# Patient Record
Sex: Female | Born: 1960 | Race: White | Hispanic: No | Marital: Married | Smoking: Former smoker
Health system: Southern US, Community
[De-identification: ages and names within clinical notes are randomized; demographics above are authoritative.]

## PROBLEM LIST (undated history)

## (undated) DIAGNOSIS — I2699 Other pulmonary embolism without acute cor pulmonale: Secondary | ICD-10-CM

## (undated) DIAGNOSIS — I82409 Acute embolism and thrombosis of unspecified deep veins of unspecified lower extremity: Secondary | ICD-10-CM

## (undated) DIAGNOSIS — E785 Hyperlipidemia, unspecified: Secondary | ICD-10-CM

## (undated) HISTORY — PX: TUBAL LIGATION: SHX77

## (undated) HISTORY — DX: Other pulmonary embolism without acute cor pulmonale: I26.99

## (undated) HISTORY — DX: Acute embolism and thrombosis of unspecified deep veins of unspecified lower extremity: I82.409

## (undated) HISTORY — DX: Hyperlipidemia, unspecified: E78.5

## (undated) HISTORY — PX: TONSILECTOMY, ADENOIDECTOMY, BILATERAL MYRINGOTOMY AND TUBES: SHX2538

---

## 2012-05-05 DIAGNOSIS — I82409 Acute embolism and thrombosis of unspecified deep veins of unspecified lower extremity: Secondary | ICD-10-CM

## 2012-05-05 DIAGNOSIS — I2699 Other pulmonary embolism without acute cor pulmonale: Secondary | ICD-10-CM

## 2012-05-05 HISTORY — DX: Acute embolism and thrombosis of unspecified deep veins of unspecified lower extremity: I82.409

## 2012-05-05 HISTORY — DX: Other pulmonary embolism without acute cor pulmonale: I26.99

## 2013-04-25 ENCOUNTER — Ambulatory Visit: Payer: Self-pay | Admitting: Family Medicine

## 2014-09-21 LAB — LIPID PANEL
CHOLESTEROL: 185 mg/dL (ref 0–200)
LDL Cholesterol: 109 mg/dL
TRIGLYCERIDES: 134 mg/dL (ref 40–160)

## 2014-09-21 LAB — HEMOGLOBIN A1C: Hgb A1c MFr Bld: 5.8 % (ref 4.0–6.0)

## 2014-09-29 IMAGING — MG MAM DGTL SCRN MAM NO ORDER W/CAD
1 series · 4 of 4 positions shown · non-contrast
Comparison: None.

CLINICAL DATA: Screening.

EXAM:
DIGITAL SCREENING BILATERAL MAMMOGRAM WITH CAD

[R CC · right · 4 of 4 slices shown]
[im 1/4]
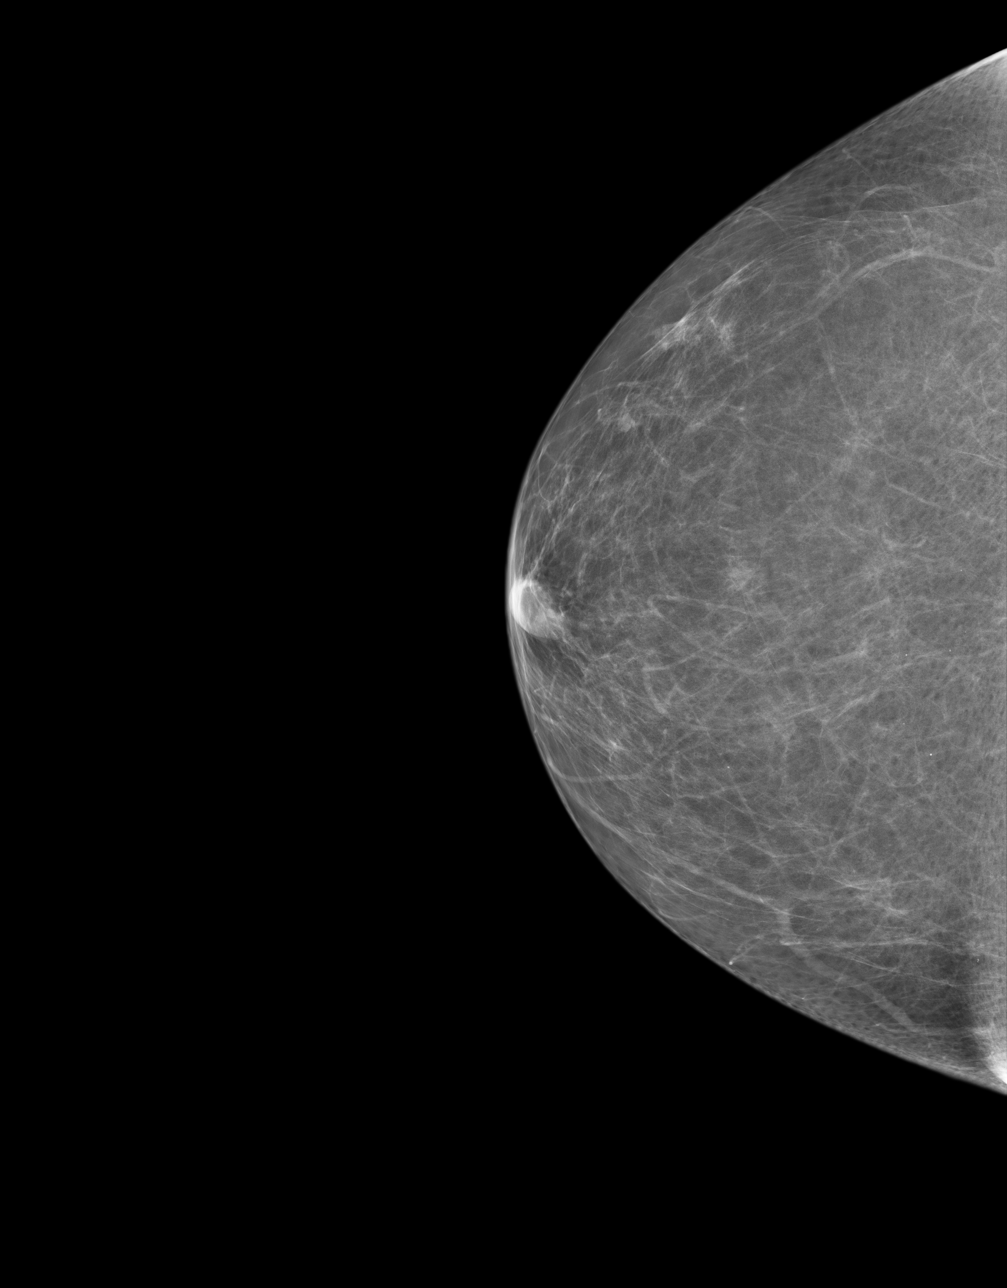
[im 2/4]
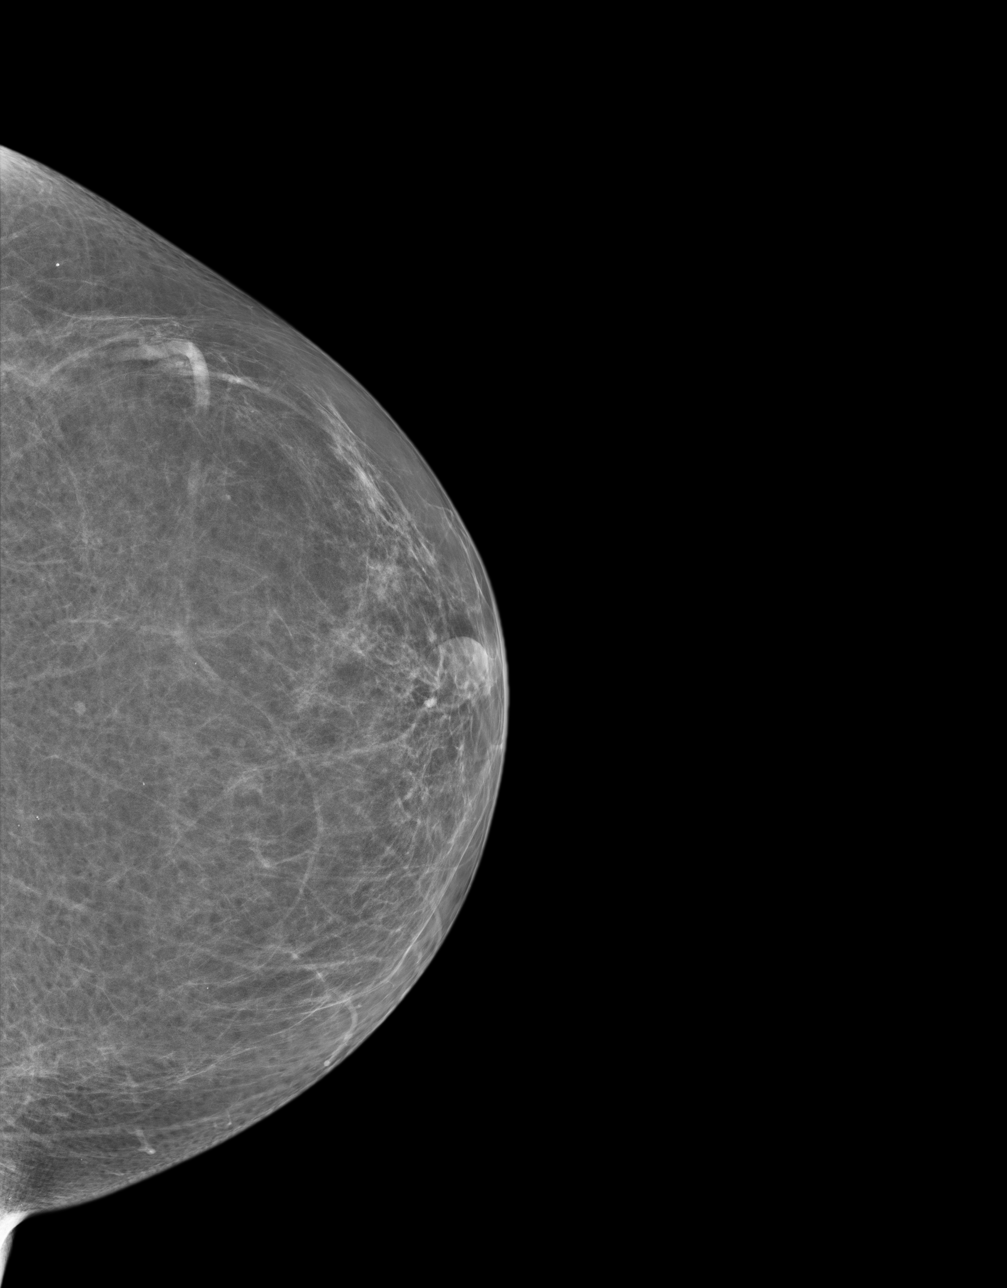
[im 3/4]
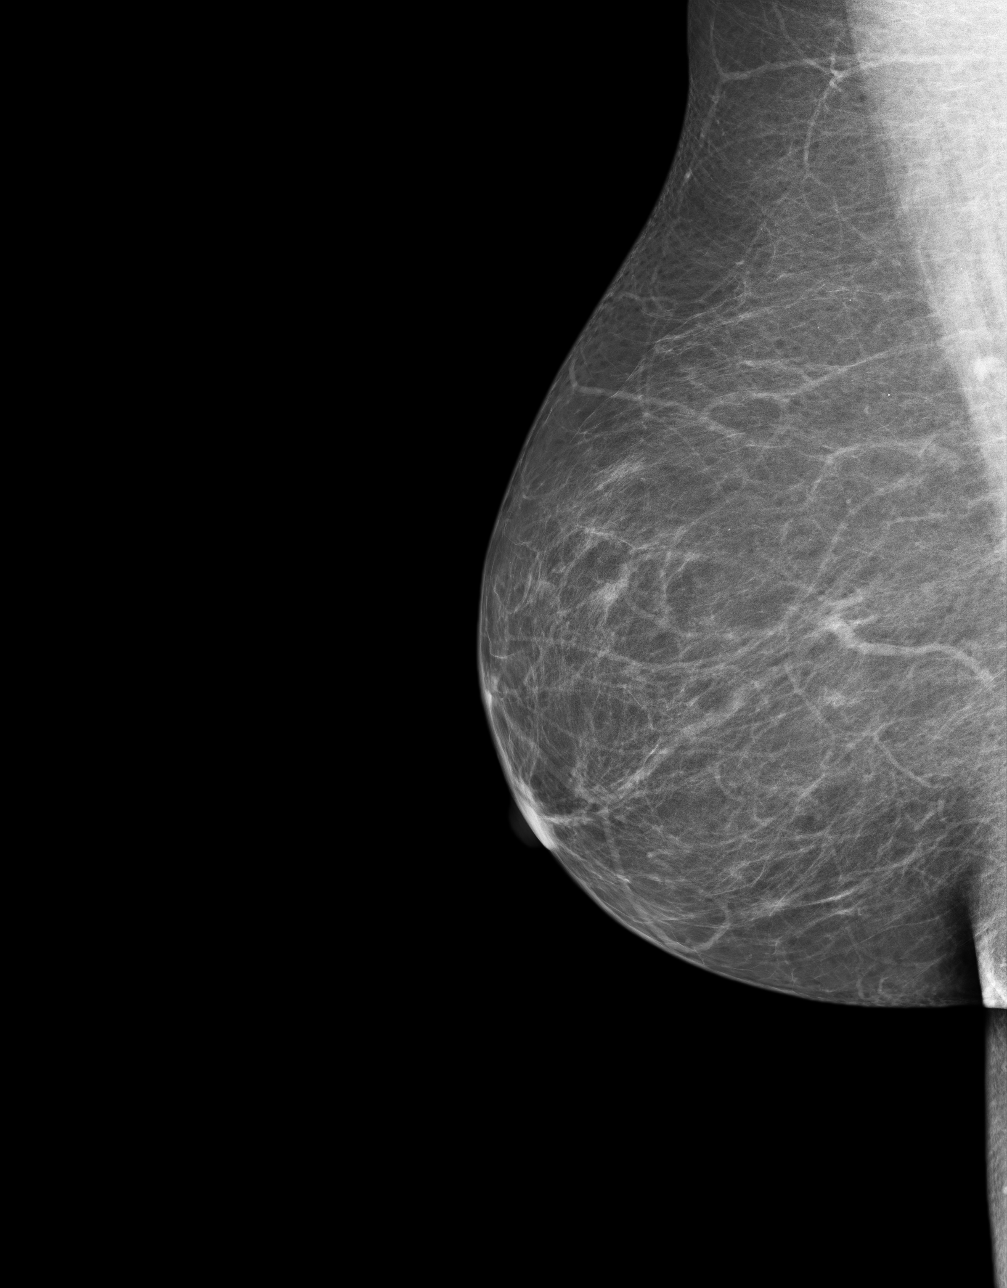
[im 4/4]
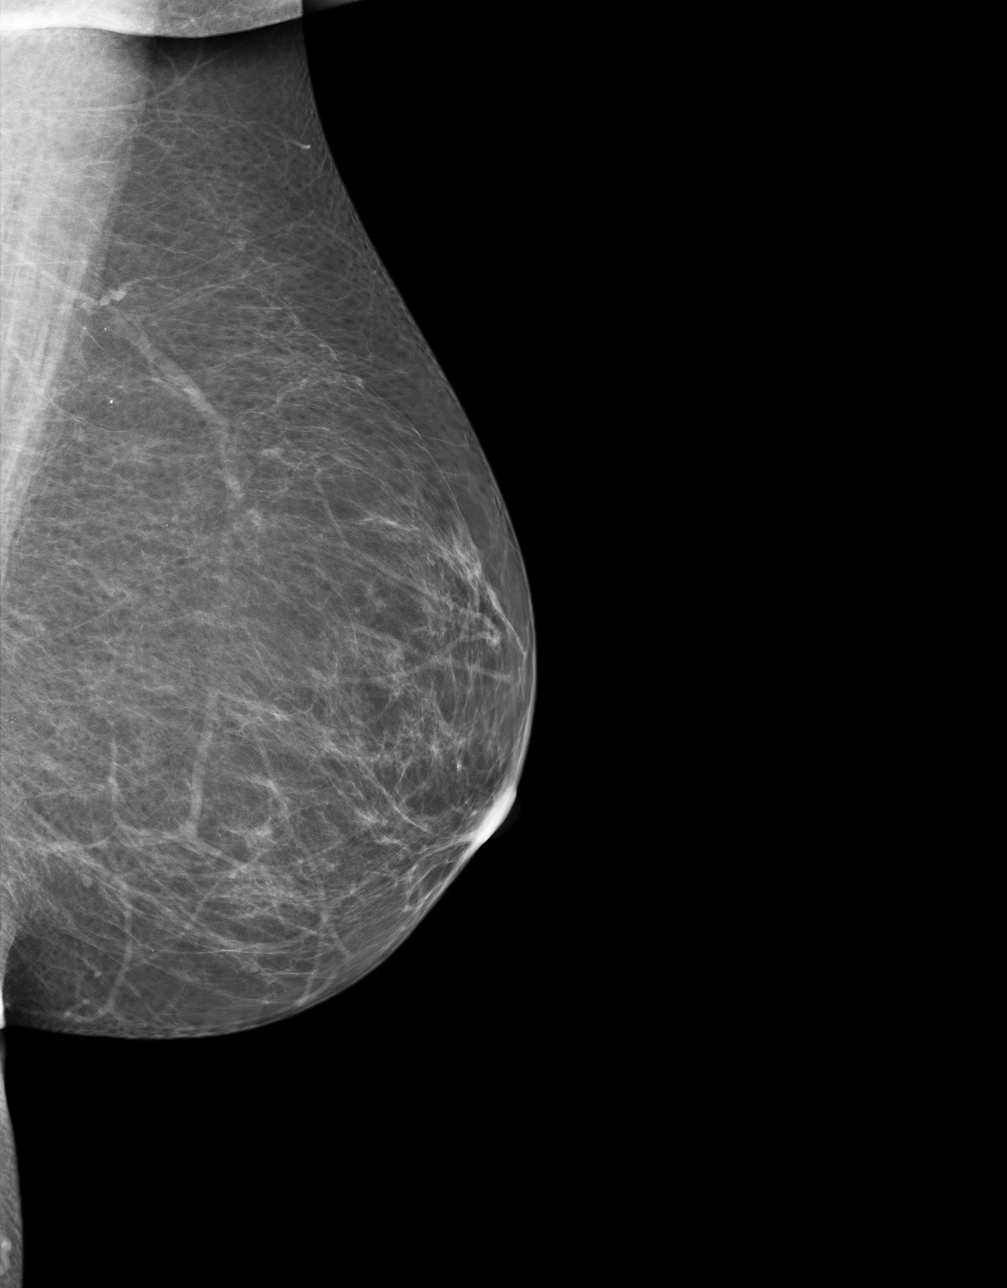

[4 of 4 positions shown; findings below may reference images not displayed]

ACR Breast Density Category b: There are scattered areas of
fibroglandular density.
FINDINGS: There are no findings suspicious for malignancy. Images were
processed with CAD.
IMPRESSION: No mammographic evidence of malignancy. A result letter of this
screening mammogram will be mailed directly to the patient.

RECOMMENDATION:
Screening mammogram in one year. (Code:WX-X-66M)

BI-RADS CATEGORY  1: Negative

## 2014-12-15 ENCOUNTER — Encounter: Payer: Self-pay | Admitting: Family Medicine

## 2014-12-15 ENCOUNTER — Ambulatory Visit
Admission: RE | Admit: 2014-12-15 | Discharge: 2014-12-15 | Disposition: A | Discharge status: Home / Self Care | Payer: Federal, State, Local not specified - PPO | Source: Ambulatory Visit | Attending: Family Medicine | Admitting: Family Medicine

## 2014-12-15 ENCOUNTER — Ambulatory Visit (INDEPENDENT_AMBULATORY_CARE_PROVIDER_SITE_OTHER): Payer: Federal, State, Local not specified - PPO | Admitting: Family Medicine

## 2014-12-15 ENCOUNTER — Telehealth: Payer: Self-pay | Admitting: Family Medicine

## 2014-12-15 VITALS — BP 109/74 | HR 62 | Temp 98.5°F | Resp 16 | Ht 66.0 in | Wt 273.6 lb

## 2014-12-15 DIAGNOSIS — M25511 Pain in right shoulder: Secondary | ICD-10-CM | POA: Insufficient documentation

## 2014-12-15 DIAGNOSIS — I2699 Other pulmonary embolism without acute cor pulmonale: Secondary | ICD-10-CM | POA: Insufficient documentation

## 2014-12-15 DIAGNOSIS — M79609 Pain in unspecified limb: Secondary | ICD-10-CM | POA: Insufficient documentation

## 2014-12-15 DIAGNOSIS — M7710 Lateral epicondylitis, unspecified elbow: Secondary | ICD-10-CM | POA: Insufficient documentation

## 2014-12-15 DIAGNOSIS — I890 Lymphedema, not elsewhere classified: Secondary | ICD-10-CM | POA: Insufficient documentation

## 2014-12-15 DIAGNOSIS — E785 Hyperlipidemia, unspecified: Secondary | ICD-10-CM | POA: Insufficient documentation

## 2014-12-15 DIAGNOSIS — IMO0002 Reserved for concepts with insufficient information to code with codable children: Secondary | ICD-10-CM | POA: Insufficient documentation

## 2014-12-15 DIAGNOSIS — I82401 Acute embolism and thrombosis of unspecified deep veins of right lower extremity: Secondary | ICD-10-CM | POA: Insufficient documentation

## 2014-12-15 DIAGNOSIS — E668 Other obesity: Secondary | ICD-10-CM | POA: Insufficient documentation

## 2014-12-15 DIAGNOSIS — R739 Hyperglycemia, unspecified: Secondary | ICD-10-CM | POA: Insufficient documentation

## 2014-12-15 MED ORDER — NAPROXEN 375 MG PO TABS: 375.0000 mg | ORAL_TABLET | Freq: Two times a day (BID) | ORAL | Status: AC

## 2014-12-15 NOTE — Assessment & Plan Note (Signed)
Refer to ortho for chronic and recurrent R shoulder pain. Obtain XR today. Suspect rotator cuff injury. Short course of NSAIDs due to past history of DVT.  Tylenol otherwise for pain. Consider PT if unable to see ortho in reasonable time.

## 2014-12-15 NOTE — Patient Instructions (Addendum)
We will get you to orthopedics to evaluate your shoulder. We will also get an XRay today to see what is going on.  Let's try naproxen up to twice daily for 1 week to see how it affects your symptoms. Start off with once daily with breakfast to see how it helps.    Shoulder Pain The shoulder is the joint that connects your arms to your body. The bones that form the shoulder joint include the upper arm bone (humerus), the shoulder blade (scapula), and the collarbone (clavicle). The top of the humerus is shaped like a ball and fits into a rather flat socket on the scapula (glenoid cavity). A combination of muscles and strong, fibrous tissues that connect muscles to bones (tendons) support your shoulder joint and hold the ball in the socket. Small, fluid-filled sacs (bursae) are located in different areas of the joint. They act as cushions between the bones and the overlying soft tissues and help reduce friction between the gliding tendons and the bone as you move your arm. Your shoulder joint allows a wide range of motion in your arm. This range of motion allows you to do things like scratch your back or throw a ball. However, this range of motion also makes your shoulder more prone to pain from overuse and injury. Causes of shoulder pain can originate from both injury and overuse and usually can be grouped in the following four categories:  Redness, swelling, and pain (inflammation) of the tendon (tendinitis) or the bursae (bursitis).  Instability, such as a dislocation of the joint.  Inflammation of the joint (arthritis).  Broken bone (fracture). HOME CARE INSTRUCTIONS   Apply ice to the sore area.  Put ice in a plastic bag.  Place a towel between your skin and the bag.  Leave the ice on for 15-20 minutes, 3-4 times per day for the first 2 days, or as directed by your health care provider.  Stop using cold packs if they do not help with the pain.  If you have a shoulder sling or immobilizer,  wear it as long as your caregiver instructs. Only remove it to shower or bathe. Move your arm as little as possible, but keep your hand moving to prevent swelling.  Squeeze a soft ball or foam pad as much as possible to help prevent swelling.  Only take over-the-counter or prescription medicines for pain, discomfort, or fever as directed by your caregiver. SEEK MEDICAL CARE IF:   Your shoulder pain increases, or new pain develops in your arm, hand, or fingers.  Your hand or fingers become cold and numb.  Your pain is not relieved with medicines. SEEK IMMEDIATE MEDICAL CARE IF:   Your arm, hand, or fingers are numb or tingling.  Your arm, hand, or fingers are significantly swollen or turn white or blue. MAKE SURE YOU:   Understand these instructions.  Will watch your condition.  Will get help right away if you are not doing well or get worse. Document Released: 01/29/2005 Document Revised: 09/05/2013 Document Reviewed: 04/05/2011 [facility] Patient Information 2015 [place], [place]. This information is not intended to replace advice given to you by your health care provider. Make sure you discuss any questions you have with your health care provider.

## 2014-12-15 NOTE — Addendum Note (Signed)
Addended by: Elvina Mattes D on: 12/15/2014 09:21 AM   Modules accepted: Kipp Brood

## 2014-12-15 NOTE — Telephone Encounter (Signed)
LMTCB regarding XR results.

## 2014-12-15 NOTE — Telephone Encounter (Signed)
LMTCB twice. 

## 2014-12-15 NOTE — Progress Notes (Signed)
Subjective:    Patient ID: Jessica Jensen, female    DOB: [birth year], 54 y.o.   MRN: [id number]  HPI: Jessica Jensen is a 54 y.o. female presenting on 12/15/2014 for Shoulder Pain   HPI  Pt presents for follow-up of shoulder pain. This has been a persistent issues for the past few months. Shoulder pain improved when she stopped her lovastatin and reduced her activity with her job.  She does a fair amount of lifting in her job.  Pain is in both shoulders but R>L. Most painful movement is reaching and turning elbow. Pain begins in shoulder and radiates down the arm.   Past Medical History  Diagnosis Date  . Hyperlipidemia   . DVT (deep venous thrombosis) 2014  . Pulmonary embolism 2014    No current outpatient prescriptions on file prior to visit.   No current facility-administered medications on file prior to visit.    Review of Systems  Constitutional: Negative for fever and chills.  HENT: Negative.   Eyes: Negative.   Respiratory: Negative for chest tightness, shortness of breath and wheezing.   Cardiovascular: Negative for chest pain, palpitations and leg swelling.  Genitourinary: Negative.   Musculoskeletal: Positive for arthralgias.  Neurological: Positive for weakness (R compared to L.). Negative for dizziness and facial asymmetry.   Per HPI unless specifically indicated above     Objective:    BP 109/74 mmHg  Pulse 62  Temp(Src) 98.5 F (36.9 C) (Oral)  Resp 16  Ht  (1.676 m)  Wt 273 lb 9.6 oz (124.104 kg)  BMI 44.18 kg/m2  LMP   Wt Readings from Last 3 Encounters:  12/15/14 273 lb 9.6 oz (124.104 kg)    Physical Exam  Constitutional: She is oriented to person, place, and time. She appears well-developed and well-nourished. No distress.  Cardiovascular: Normal rate and regular rhythm.  Exam reveals no gallop and no friction rub.   No murmur heard. Pulmonary/Chest: Effort normal and breath sounds normal. She has no wheezes. She exhibits no tenderness.   Musculoskeletal: She exhibits tenderness.       Right shoulder: She exhibits tenderness and decreased strength. She exhibits normal range of motion, no swelling, no effusion and no deformity.  +hawkins kennedy test.   Neurological: She is alert and oriented to person, place, and time.  Skin: Skin is warm and dry. No rash noted. She is not diaphoretic. No erythema.   No results found for this or any previous visit.    Assessment & Plan:   Problem List Items Addressed This Visit      Other   Right shoulder pain - Primary    Refer to ortho for chronic and recurrent R shoulder pain. Obtain XR today. Suspect rotator cuff injury. Short course of NSAIDs due to past history of DVT.  Tylenol otherwise for pain. Consider PT if unable to see ortho in reasonable time.       Relevant Medications   naproxen (NAPROSYN) 375 MG tablet   Other Relevant Orders   DG Shoulder Right   Ambulatory referral to Orthopedic Surgery      Meds ordered this encounter  Medications  . aspirin 81 MG tablet    Sig: Take 81 mg by mouth daily.  . Omega-3 Fatty Acids (FISH OIL) 1000 MG CAPS    Sig: Take 1 capsule by mouth daily.  . naproxen (NAPROSYN) 375 MG tablet    Sig: Take 1 tablet (375 mg total) by mouth 2 (two)  times daily with a meal.    Dispense:  14 tablet    Refill:  1    Order Specific Question:  Supervising Provider    Answer:  Janeann Forehand [[id number]]      Follow up plan: Return if symptoms worsen or fail to improve.

## 2014-12-18 NOTE — Telephone Encounter (Signed)
Reviewed XR results with patient. We will continue with her referral to ortho.

## 2014-12-20 ENCOUNTER — Telehealth: Payer: Self-pay | Admitting: *Deleted

## 2014-12-20 NOTE — Telephone Encounter (Signed)
Pt made aware of appt 12/25/14 @ [facility].

## 2016-05-20 IMAGING — CR DG SHOULDER 2+V*R*
1 series · 3 of 3 positions shown · non-contrast
Comparison: None in PACs

CLINICAL DATA: Right shoulder pain with symptoms extending to the
finger tips, remote history of injury, now with progressive symptoms

EXAM:
RIGHT SHOULDER - 2+ VIEW

[Series 1: grashey · 0.17mm/px · 3 of 3 slices shown]
[im 1/3]
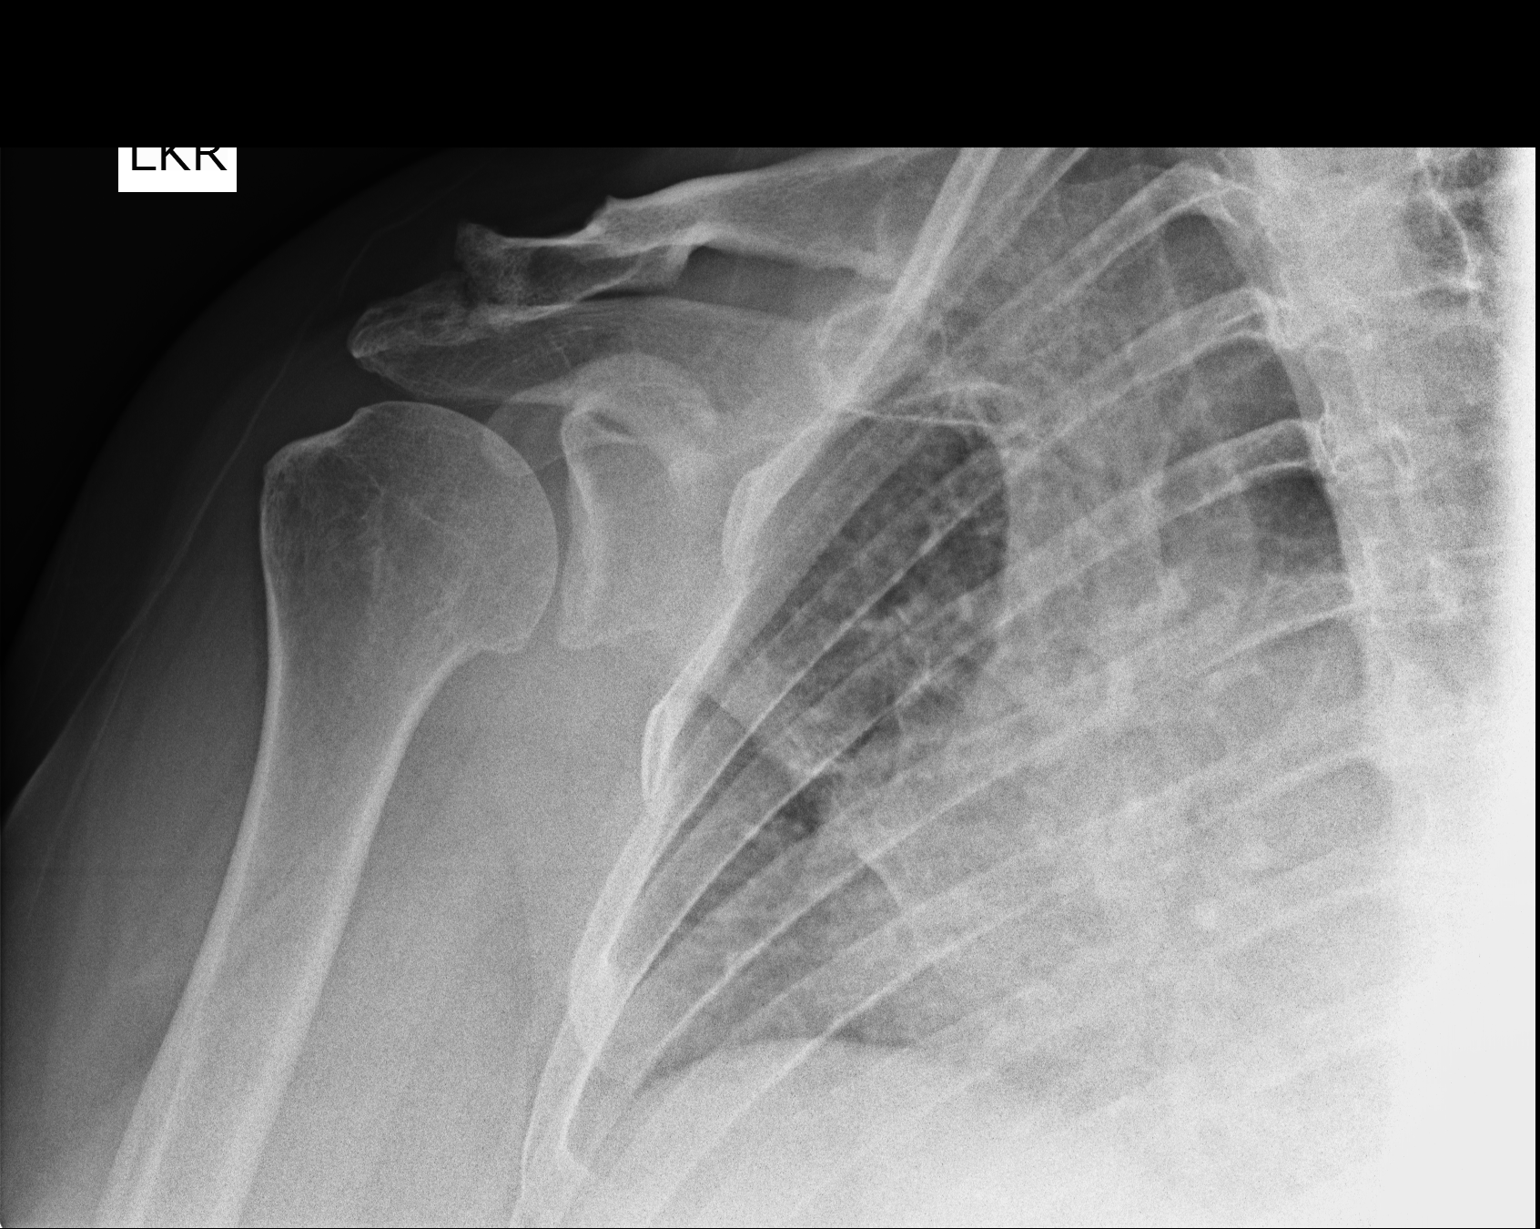
[im 2/3]
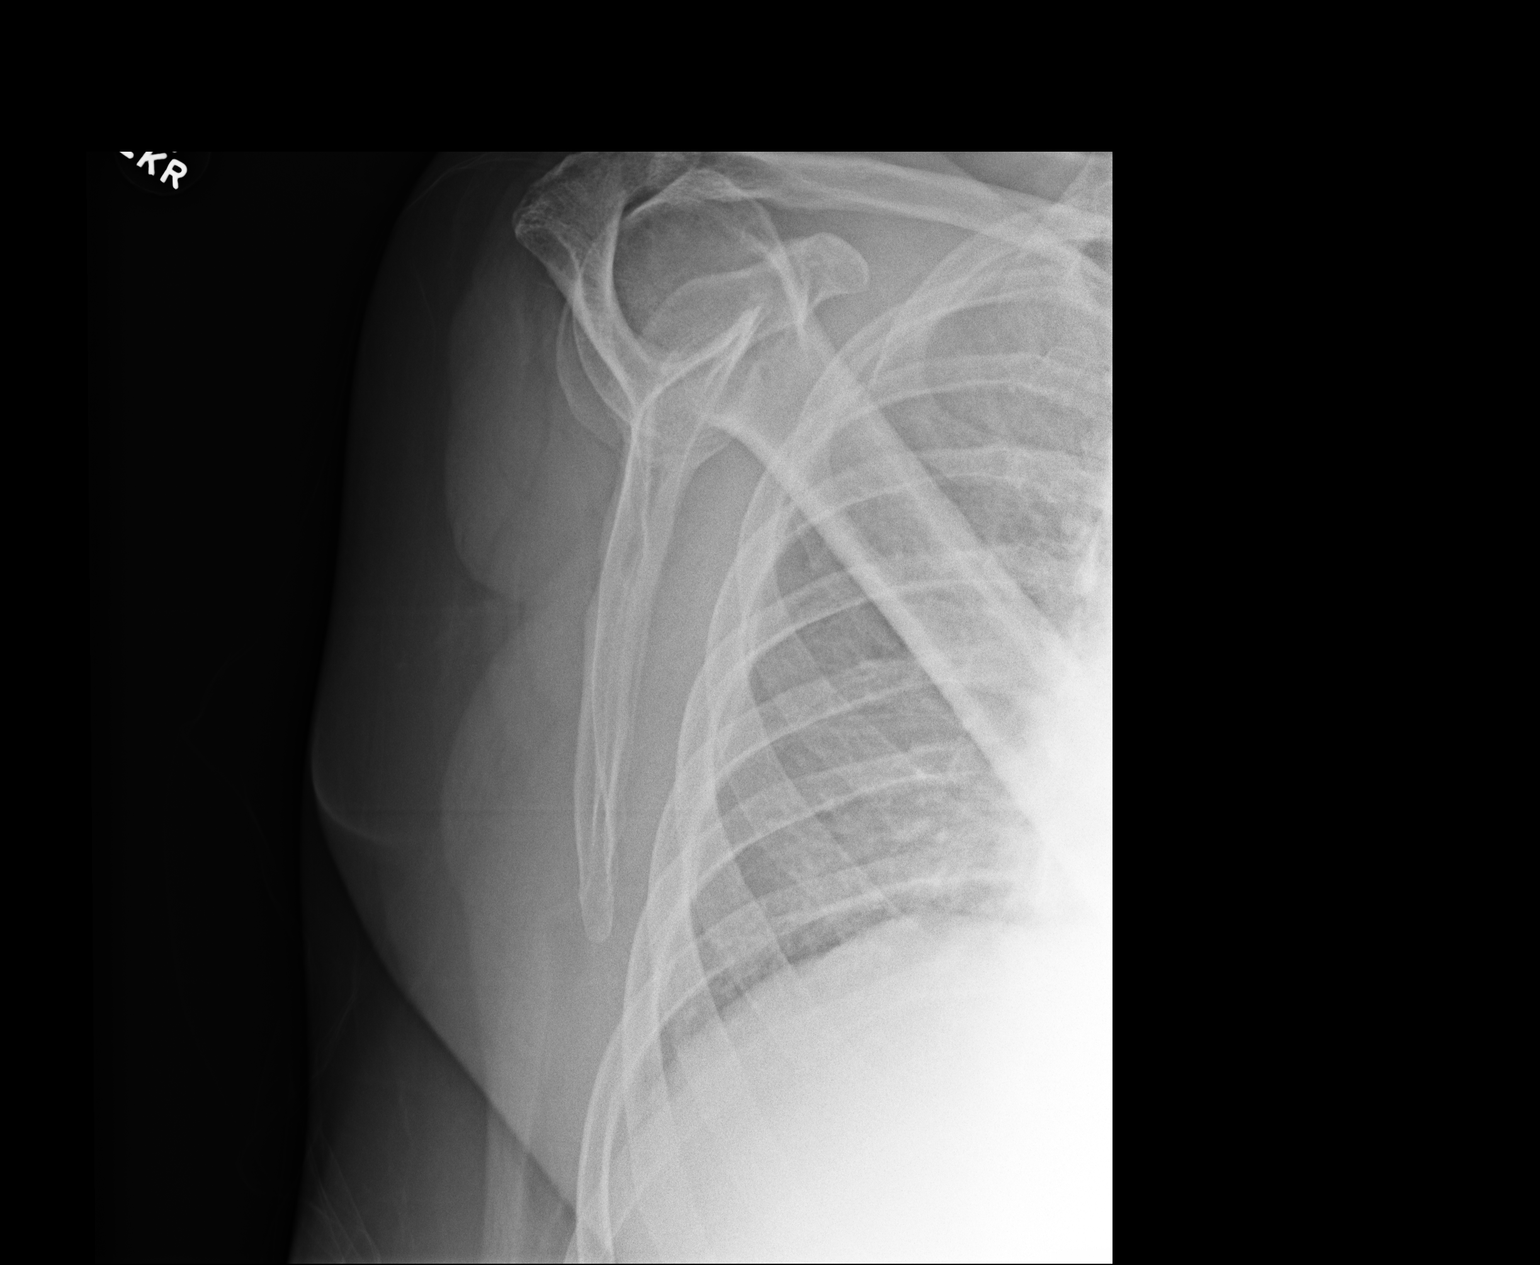
[im 3/3]
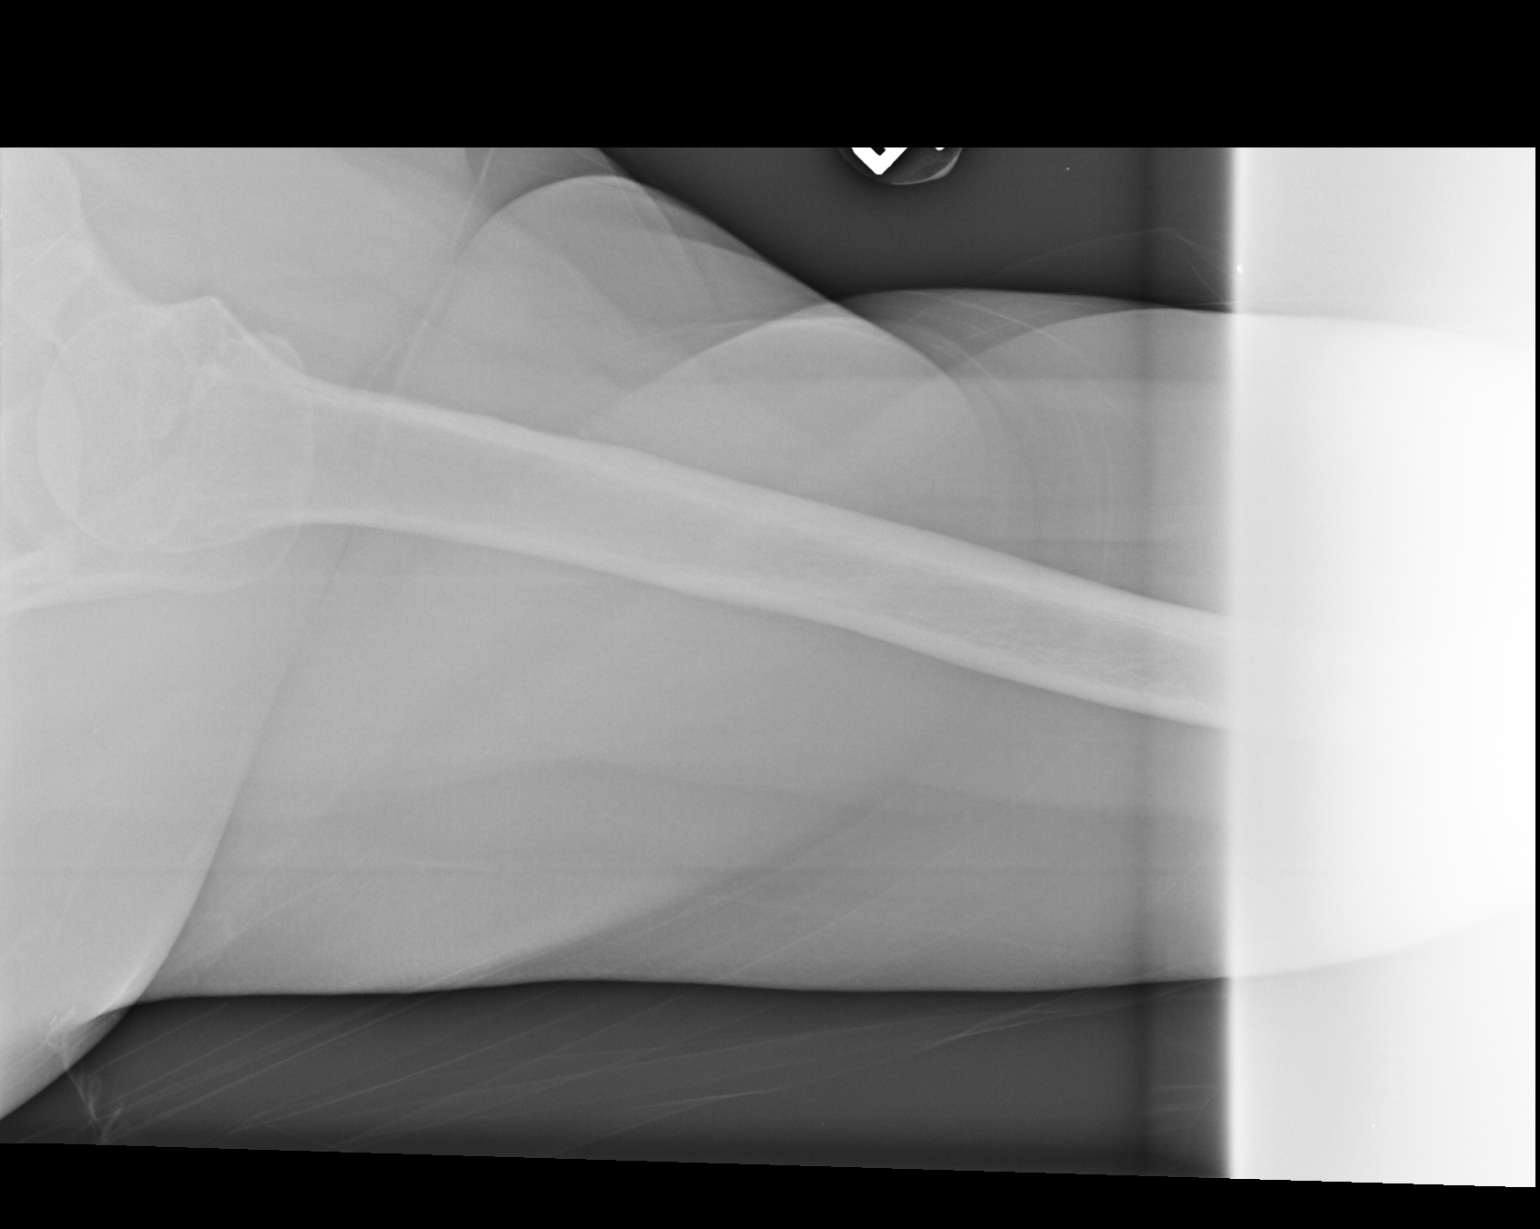

[3 of 3 positions shown; findings below may reference images not displayed]

FINDINGS: The bones are adequately mineralized. There is mild irregularity of
the articular surface of the glenoid. The humeral head is smoothly
rounded. The subacromial subdeltoid space is preserved. There is
moderate narrowing and articular margin scleroses of the AC joint.
The observed portions of the right clavicle and upper right ribs are
normal.
IMPRESSION: There is mild degenerative change of the glenohumeral joint with
moderate degenerative change of the AC joint. There is no acute bony
abnormality.

## 2021-04-04 DEATH — deceased
